# Patient Record
Sex: Male | Born: 2000 | Race: Black or African American | Hispanic: No | Marital: Single | Smoking: Never smoker
Health system: Southern US, Community
[De-identification: ages and names within clinical notes are randomized; demographics above are authoritative.]

## PROBLEM LIST (undated history)

## (undated) DIAGNOSIS — J45909 Unspecified asthma, uncomplicated: Secondary | ICD-10-CM

## (undated) HISTORY — PX: FEMUR CLOSED REDUCTION: SHX939

---
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
## 2022-04-13 ENCOUNTER — Other Ambulatory Visit: Payer: Self-pay

## 2022-04-13 DIAGNOSIS — R0981 Nasal congestion: Secondary | ICD-10-CM | POA: Diagnosis present

## 2022-04-13 DIAGNOSIS — J019 Acute sinusitis, unspecified: Secondary | ICD-10-CM | POA: Diagnosis not present

## 2022-04-13 DIAGNOSIS — J45909 Unspecified asthma, uncomplicated: Secondary | ICD-10-CM | POA: Insufficient documentation

## 2022-04-13 DIAGNOSIS — Z20822 Contact with and (suspected) exposure to covid-19: Secondary | ICD-10-CM | POA: Insufficient documentation

## 2022-04-14 ENCOUNTER — Emergency Department (HOSPITAL_BASED_OUTPATIENT_CLINIC_OR_DEPARTMENT_OTHER)
Admission: EM | Admit: 2022-04-14 | Discharge: 2022-04-14 | Disposition: A | Discharge status: Home / Self Care | Payer: BC Managed Care – PPO | Attending: Emergency Medicine | Admitting: Emergency Medicine

## 2022-04-14 ENCOUNTER — Encounter (HOSPITAL_BASED_OUTPATIENT_CLINIC_OR_DEPARTMENT_OTHER): Payer: Self-pay | Admitting: Urology

## 2022-04-14 DIAGNOSIS — J019 Acute sinusitis, unspecified: Secondary | ICD-10-CM

## 2022-04-14 LAB — SARS CORONAVIRUS 2 BY RT PCR: SARS Coronavirus 2 by RT PCR: NEGATIVE

## 2022-04-14 MED ORDER — ONDANSETRON 8 MG PO TBDP: 8.0000 mg | ORAL_TABLET | Freq: Three times a day (TID) | ORAL | 0 refills | Status: AC | PRN

## 2022-04-14 MED ORDER — ONDANSETRON 4 MG PO TBDP
8.0000 mg | ORAL_TABLET | Freq: Once | ORAL | Status: AC
  Administered 2022-04-14: 8 mg via ORAL
  Filled 2022-04-14: qty 2

## 2022-04-14 MED ORDER — DOXYCYCLINE HYCLATE 100 MG PO CAPS: 100.0000 mg | ORAL_CAPSULE | Freq: Two times a day (BID) | ORAL | 0 refills | Status: AC

## 2022-04-14 MED ORDER — OXYMETAZOLINE HCL 0.05 % NA SOLN
2.0000 | Freq: Two times a day (BID) | NASAL | Status: DC | PRN
  Administered 2022-04-14: 2 via NASAL
  Filled 2022-04-14: qty 30

## 2022-04-14 MED ORDER — DOXYCYCLINE HYCLATE 100 MG PO TABS
100.0000 mg | ORAL_TABLET | Freq: Once | ORAL | Status: AC
  Administered 2022-04-14: 100 mg via ORAL
  Filled 2022-04-14: qty 1

## 2022-04-14 NOTE — ED Provider Notes (Signed)
[facility] Provider Note: Lowella Dell, MD, FACEP  CSN: [id number] MRN: [id number] ARRIVAL: 04/13/22 at 2350 ROOM: MH11/MH11   CHIEF COMPLAINT  URI   HISTORY OF PRESENT ILLNESS  04/14/22 1:17 AM Wesley Carter is a 21 y.o. male with 1 week of nasal congestion without rhinorrhea, nausea and headache.  He denies having a fever but states he feels overheated a lot.  He rates his headache as a 7 out of 10.  He believes this happened after swimming in the swimming pool.  He has had no cough or shortness of breath.  He denies ear pain.   Past Medical History:  Diagnosis Date   Asthma     Past Surgical History:  Procedure Laterality Date   FEMUR CLOSED REDUCTION      History reviewed. No pertinent family history.  Social History   Tobacco Use   Smoking status: Never  Substance Use Topics   Alcohol use: No   Drug use: No    Prior to Admission medications   Medication Sig Start Date End Date Taking? Authorizing Provider  doxycycline (VIBRAMYCIN) 100 MG capsule Take 1 capsule (100 mg total) by mouth 2 (two) times daily. One po bid x 7 days 04/14/22  Yes Ajahni Nay, MD  ondansetron (ZOFRAN-ODT) 8 MG disintegrating tablet Take 1 tablet (8 mg total) by mouth every 8 (eight) hours as needed for nausea or vomiting. 04/14/22  Yes Richie Bonanno, MD    Allergies Patient has no known allergies.   REVIEW OF SYSTEMS  Negative except as noted here or in the History of Present Illness.   PHYSICAL EXAMINATION  Initial Vital Signs Blood pressure 119/76, pulse 92, temperature 98.8 F (37.1 C), temperature source Oral, resp. rate 18, height 5\' 11"  (1.803 m), weight 81.6 kg, SpO2 97 %.  Examination General: Well-developed, well-nourished male in no acute distress; appearance consistent with age of record HENT: normocephalic; atraumatic; TMs normal; nasal congestion Eyes: Normal appearing Neck: supple Heart: regular rate and rhythm Lungs: clear to auscultation  bilaterally Abdomen: soft; nondistended; nontender; bowel sounds present Extremities: No deformity; full range of motion; pulses normal Neurologic: Awake, alert and oriented; motor function intact in all extremities and symmetric; no facial droop Skin: Warm and dry Psychiatric: Normal mood and affect   RESULTS  Summary of this visit's results, reviewed and interpreted by myself:   EKG Interpretation  Date/Time:    Ventricular Rate:    PR Interval:    QRS Duration:   QT Interval:    QTC Calculation:   R Axis:     Text Interpretation:         Laboratory Studies: Results for orders placed or performed during the hospital encounter of 04/14/22 (from the past 24 hour(s))  SARS Coronavirus 2 by RT PCR (hospital order, performed in [facility] hospital lab) *cepheid single result test* Anterior Nasal Swab     Status: None   Collection Time: 04/14/22 12:15 AM   Specimen: Anterior Nasal Swab  Result Value Ref Range   SARS Coronavirus 2 by RT PCR NEGATIVE NEGATIVE   Imaging Studies: No results found.  ED COURSE and MDM  Nursing notes, initial and subsequent vitals signs, including pulse oximetry, reviewed and interpreted by myself.  Vitals:   04/14/22 0012 04/14/22 0014  BP:  119/76  Pulse:  92  Resp:  18  Temp:  98.8 F (37.1 C)  TempSrc:  Oral  SpO2:  97%  Weight: 81.6 kg   Height: 5'  11" (1.803 m)    Medications  oxymetazoline (AFRIN) 0.05 % nasal spray 2 spray (has no administration in time range)  doxycycline (VIBRA-TABS) tablet 100 mg (has no administration in time range)  ondansetron (ZOFRAN-ODT) disintegrating tablet 8 mg (has no administration in time range)    Presentation most consistent with a sinus infection.  We will treat with doxycycline and a brief course of oxymetazoline.  He appears nontoxic and there are no meningeal signs.  His headache is primarily frontal which is consistent with a sinus headache.  PROCEDURES  Procedures   ED DIAGNOSES      ICD-10-CM   1. Acute sinusitis, recurrence not specified, unspecified location  J01.90          Anagha Loseke, Jonny Ruiz, MD 04/14/22 0126

## 2022-04-14 NOTE — ED Triage Notes (Signed)
Pt states nose runny, nausea, headache x 1 week  States "I feel overheated a lot"  Denies fever
